# Patient Record
Sex: Male | Born: 1985 | Race: White | Hispanic: No | Marital: Married | Smoking: Never smoker
Health system: Southern US, Community
[De-identification: ages and names within clinical notes are randomized; demographics above are authoritative.]

---

## 2015-09-07 ENCOUNTER — Emergency Department (HOSPITAL_COMMUNITY): Payer: No Typology Code available for payment source

## 2015-09-07 ENCOUNTER — Encounter (HOSPITAL_COMMUNITY): Payer: Self-pay | Admitting: Nurse Practitioner

## 2015-09-07 ENCOUNTER — Emergency Department (HOSPITAL_COMMUNITY)
Admission: EM | Admit: 2015-09-07 | Discharge: 2015-09-08 | Disposition: A | Discharge status: Home / Self Care | Payer: No Typology Code available for payment source | Attending: Emergency Medicine | Admitting: Emergency Medicine

## 2015-09-07 DIAGNOSIS — S3991XA Unspecified injury of abdomen, initial encounter: Secondary | ICD-10-CM | POA: Insufficient documentation

## 2015-09-07 DIAGNOSIS — Y92410 Unspecified street and highway as the place of occurrence of the external cause: Secondary | ICD-10-CM | POA: Diagnosis not present

## 2015-09-07 DIAGNOSIS — Y9389 Activity, other specified: Secondary | ICD-10-CM | POA: Insufficient documentation

## 2015-09-07 DIAGNOSIS — S299XXA Unspecified injury of thorax, initial encounter: Secondary | ICD-10-CM | POA: Insufficient documentation

## 2015-09-07 DIAGNOSIS — S0083XA Contusion of other part of head, initial encounter: Secondary | ICD-10-CM | POA: Insufficient documentation

## 2015-09-07 DIAGNOSIS — S0990XA Unspecified injury of head, initial encounter: Secondary | ICD-10-CM | POA: Diagnosis present

## 2015-09-07 DIAGNOSIS — S199XXA Unspecified injury of neck, initial encounter: Secondary | ICD-10-CM | POA: Insufficient documentation

## 2015-09-07 DIAGNOSIS — S060X1A Concussion with loss of consciousness of 30 minutes or less, initial encounter: Secondary | ICD-10-CM | POA: Diagnosis not present

## 2015-09-07 DIAGNOSIS — Y998 Other external cause status: Secondary | ICD-10-CM | POA: Diagnosis not present

## 2015-09-07 DIAGNOSIS — M542 Cervicalgia: Secondary | ICD-10-CM

## 2015-09-07 LAB — I-STAT CHEM 8, ED
BUN: 7 mg/dL (ref 6–20)
CALCIUM ION: 1.15 mmol/L (ref 1.12–1.23)
CHLORIDE: 105 mmol/L (ref 101–111)
Creatinine, Ser: 1.1 mg/dL (ref 0.61–1.24)
GLUCOSE: 91 mg/dL (ref 65–99)
HCT: 47 % (ref 39.0–52.0)
Hemoglobin: 16 g/dL (ref 13.0–17.0)
Potassium: 3.9 mmol/L (ref 3.5–5.1)
SODIUM: 141 mmol/L (ref 135–145)
TCO2: 25 mmol/L (ref 0–100)

## 2015-09-07 LAB — SAMPLE TO BLOOD BANK

## 2015-09-07 LAB — CBC
HCT: 43.4 % (ref 39.0–52.0)
HEMOGLOBIN: 14.9 g/dL (ref 13.0–17.0)
MCH: 29.4 pg (ref 26.0–34.0)
MCHC: 34.3 g/dL (ref 30.0–36.0)
MCV: 85.8 fL (ref 78.0–100.0)
PLATELETS: 202 10*3/uL (ref 150–400)
RBC: 5.06 MIL/uL (ref 4.22–5.81)
RDW: 13.6 % (ref 11.5–15.5)
WBC: 7.9 10*3/uL (ref 4.0–10.5)

## 2015-09-07 LAB — PROTIME-INR
INR: 1.21 (ref 0.00–1.49)
PROTHROMBIN TIME: 15.5 s — AB (ref 11.6–15.2)

## 2015-09-07 LAB — CDS SEROLOGY

## 2015-09-07 LAB — I-STAT CREATININE, ED: CREATININE: 1.2 mg/dL (ref 0.61–1.24)

## 2015-09-07 MED ORDER — IOHEXOL 300 MG/ML  SOLN
100.0000 mL | Freq: Once | INTRAMUSCULAR | Status: AC | PRN
  Administered 2015-09-08: 100 mL via INTRAVENOUS

## 2015-09-07 MED ORDER — MORPHINE SULFATE (PF) 4 MG/ML IV SOLN
4.0000 mg | Freq: Once | INTRAVENOUS | Status: AC
  Administered 2015-09-07: 4 mg via INTRAVENOUS
  Filled 2015-09-07: qty 1

## 2015-09-07 NOTE — ED Notes (Signed)
Called family to update per patient request. [phone]
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
## 2015-09-08 LAB — COMPREHENSIVE METABOLIC PANEL
ALT: 42 U/L (ref 17–63)
AST: 69 U/L — ABNORMAL HIGH (ref 15–41)
Albumin: 4.4 g/dL (ref 3.5–5.0)
Alkaline Phosphatase: 101 U/L (ref 38–126)
Anion gap: 13 (ref 5–15)
BUN: 5 mg/dL — AB (ref 6–20)
CALCIUM: 9.6 mg/dL (ref 8.9–10.3)
CHLORIDE: 103 mmol/L (ref 101–111)
CO2: 24 mmol/L (ref 22–32)
CREATININE: 1.22 mg/dL (ref 0.61–1.24)
GFR calc non Af Amer: >60 mL/min (ref >60)
Glucose, Bld: 96 mg/dL (ref 65–99)
POTASSIUM: 3.8 mmol/L (ref 3.5–5.1)
SODIUM: 140 mmol/L (ref 135–145)
TOTAL PROTEIN: 7.6 g/dL (ref 6.5–8.1)
Total Bilirubin: 0.5 mg/dL (ref 0.3–1.2)

## 2015-09-08 LAB — ETHANOL

## 2015-09-08 MED ORDER — METHOCARBAMOL 500 MG PO TABS: 500.0000 mg | ORAL_TABLET | Freq: Every evening | ORAL | Status: AC | PRN

## 2015-09-08 MED ORDER — IBUPROFEN 600 MG PO TABS
600.0000 mg | ORAL_TABLET | Freq: Four times a day (QID) | ORAL | Status: AC | PRN
Start: 2015-09-08 — End: 2015-09-14

## 2015-09-08 MED ORDER — HYDROCODONE-ACETAMINOPHEN 5-325 MG PO TABS: 1.0000 | ORAL_TABLET | Freq: Three times a day (TID) | ORAL | Status: AC | PRN

## 2015-09-08 MED ORDER — MORPHINE SULFATE (PF) 4 MG/ML IV SOLN
6.0000 mg | Freq: Once | INTRAVENOUS | Status: AC
  Administered 2015-09-08: 6 mg via INTRAVENOUS
  Filled 2015-09-08: qty 2

## 2015-09-08 NOTE — ED Notes (Signed)
Pt stable, ambulatory, states understanding of discharge instructions 
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
## 2016-02-25 ENCOUNTER — Emergency Department (HOSPITAL_COMMUNITY): Payer: Self-pay

## 2016-02-25 ENCOUNTER — Emergency Department (HOSPITAL_COMMUNITY)
Admission: EM | Admit: 2016-02-25 | Discharge: 2016-02-25 | Disposition: A | Discharge status: Home / Self Care | Payer: Self-pay | Attending: Emergency Medicine | Admitting: Emergency Medicine

## 2016-02-25 ENCOUNTER — Encounter (HOSPITAL_COMMUNITY): Payer: Self-pay | Admitting: *Deleted

## 2016-02-25 DIAGNOSIS — W1789XA Other fall from one level to another, initial encounter: Secondary | ICD-10-CM | POA: Insufficient documentation

## 2016-02-25 DIAGNOSIS — M545 Low back pain, unspecified: Secondary | ICD-10-CM

## 2016-02-25 DIAGNOSIS — M25551 Pain in right hip: Secondary | ICD-10-CM | POA: Insufficient documentation

## 2016-02-25 DIAGNOSIS — R079 Chest pain, unspecified: Secondary | ICD-10-CM | POA: Insufficient documentation

## 2016-02-25 DIAGNOSIS — R52 Pain, unspecified: Secondary | ICD-10-CM

## 2016-02-25 DIAGNOSIS — Y999 Unspecified external cause status: Secondary | ICD-10-CM | POA: Insufficient documentation

## 2016-02-25 DIAGNOSIS — S63501A Unspecified sprain of right wrist, initial encounter: Secondary | ICD-10-CM | POA: Insufficient documentation

## 2016-02-25 DIAGNOSIS — M25552 Pain in left hip: Secondary | ICD-10-CM | POA: Insufficient documentation

## 2016-02-25 DIAGNOSIS — Y939 Activity, unspecified: Secondary | ICD-10-CM | POA: Insufficient documentation

## 2016-02-25 DIAGNOSIS — Y929 Unspecified place or not applicable: Secondary | ICD-10-CM | POA: Insufficient documentation

## 2016-02-25 DIAGNOSIS — M546 Pain in thoracic spine: Secondary | ICD-10-CM | POA: Insufficient documentation

## 2016-02-25 DIAGNOSIS — W19XXXA Unspecified fall, initial encounter: Secondary | ICD-10-CM

## 2016-02-25 MED ORDER — HYDROMORPHONE HCL 1 MG/ML IJ SOLN
1.0000 mg | Freq: Once | INTRAMUSCULAR | Status: AC
  Administered 2016-02-25: 1 mg via INTRAMUSCULAR
  Filled 2016-02-25: qty 1

## 2016-02-25 MED ORDER — OXYCODONE-ACETAMINOPHEN 5-325 MG PO TABS
1.0000 | ORAL_TABLET | Freq: Once | ORAL | Status: AC
  Administered 2016-02-25: 1 via ORAL
  Filled 2016-02-25: qty 1

## 2016-02-25 MED ORDER — OXYCODONE-ACETAMINOPHEN 5-325 MG PO TABS: 1.0000 | ORAL_TABLET | Freq: Four times a day (QID) | ORAL | 0 refills | Status: AC | PRN

## 2016-02-25 MED ORDER — NAPROXEN 500 MG PO TABS
500.0000 mg | ORAL_TABLET | Freq: Once | ORAL | Status: AC
  Administered 2016-02-25: 500 mg via ORAL
  Filled 2016-02-25: qty 1

## 2016-02-25 MED ORDER — NAPROXEN 500 MG PO TABS: 500.0000 mg | ORAL_TABLET | Freq: Two times a day (BID) | ORAL | 0 refills | Status: AC

## 2016-02-25 NOTE — ED Provider Notes (Signed)
[facility]-EMERGENCY DEPT Provider Note   CSN: [id number] Arrival date & time: 02/25/16  1454  First Provider Contact:  None    By signing my name below, I, [name], attest that this documentation has been prepared under the direction and in the presence of non-physician practitioner, Cheri Fowler, PA-C. Electronically Signed: [name], Scribe. 02/25/2016. 4:44 PM.  History   Chief Complaint Chief Complaint  Patient presents with  . Wrist Injury  . Hand Injury  . Hip Injury   The history is provided by the patient. No language interpreter was used.    HPI Comments: James Lucero is a 30 y.o. male who presents to the Emergency Department complaining of sudden onset, gradually worsening, "burning," right wrist and left hip pain s/p a fall that occurred earlier this morning. Pt reports he was standing on the bumper of his dad's lifted truck ~36 inches when he lost his balance and fell backwards; he notes he used his right wrist to catch himself and hyperextended it. He denies hitting his head or losing consciousness. He states the right side of his body is more painful compared to his left. He reports associated numbness of his right leg; he states he is experiencing shooting pain that radiates upwards through his lower back. He states he took tylenol with mild relief. Pt notes PSHx of bilateral hip fractures requiring ORIF.  History reviewed. No pertinent past medical history.  There are no active problems to display for this patient.  History reviewed. No pertinent surgical history.  Home Medications    Prior to Admission medications   Medication Sig Start Date End Date Taking? Authorizing Provider  naproxen (NAPROSYN) 500 MG tablet Take 1 tablet (500 mg total) by mouth 2 (two) times daily. 02/25/16   Cheri Fowler, PA-C  oxyCODONE-acetaminophen (PERCOCET/ROXICET) 5-325 MG tablet Take 1 tablet by mouth every 6 (six) hours as needed for severe pain. 02/25/16   Cheri Fowler, PA-C   Family  History No family history on file.  Social History Social History  Substance Use Topics  . Smoking status: Never Smoker  . Smokeless tobacco: Never Used  . Alcohol use No   Allergies   Review of patient's allergies indicates not on file.  Review of Systems Review of Systems  Musculoskeletal: Positive for arthralgias (right wrist and left hip), back pain and myalgias.  Neurological: Positive for numbness.  All other systems reviewed and are negative.  Physical Exam Updated Vital Signs BP 115/72 (BP Location: Left Arm)   Pulse 74   Temp 98.1 F (36.7 C) (Oral)   Resp 15   Ht  (1.753 m)   Wt 185 lb (83.9 kg)   SpO2 100%   BMI 27.32 kg/m   Physical Exam  Constitutional: He is oriented to person, place, and time. He appears well-developed and well-nourished.  HENT:  Head: Normocephalic and atraumatic.  Right Ear: External ear normal.  Left Ear: External ear normal.  Eyes: Conjunctivae are normal. No scleral icterus.  Neck: No tracheal deviation present.  No cervical midline tenderness.   Cardiovascular: Normal rate and regular rhythm.   Pulses:      Dorsalis pedis pulses are 2+ on the right side, and 2+ on the left side.  Pulmonary/Chest: Effort normal and breath sounds normal. No respiratory distress.  Abdominal: Soft. He exhibits no distension. There is no tenderness.  Musculoskeletal: He exhibits tenderness.       Right wrist: He exhibits decreased range of motion (due to pain), tenderness and  bony tenderness (mild anatomical snuffbox tenderness. ).       Right hip: He exhibits decreased range of motion and tenderness. He exhibits normal strength.       Left hip: He exhibits decreased range of motion and tenderness. He exhibits normal strength.       Thoracic back: He exhibits decreased range of motion (due to pain), tenderness, bony tenderness and pain.       Lumbar back: He exhibits decreased range of motion (due to pain), tenderness, bony tenderness and pain.        Right hand: He exhibits decreased range of motion (due to pain), tenderness and bony tenderness. He exhibits normal capillary refill. Normal sensation noted. Normal strength noted.  Thoracic and lumbar midline tenderness.   Neurological: He is alert and oriented to person, place, and time.  Strength and sensation intact throughout lower extremities.   Skin: Skin is warm and dry.  Psychiatric: He has a normal mood and affect. His behavior is normal.   ED Treatments / Results  Labs (all labs ordered are listed, but only abnormal results are displayed) Labs Reviewed - No data to display  EKG  EKG Interpretation None       Radiology Dg Thoracic Spine 2 View  Result Date: 02/25/2016 CLINICAL DATA:  Sudden onset of back pain after fall this morning, gradually worsening. EXAM: THORACIC SPINE 2 VIEWS COMPARISON:  CT chest dated 09/08/2015. FINDINGS: Mild dextroscoliosis of the thoracic spine is stable. There is also mild kyphosis. There is mild compression deformity of several vertebral bodies within the mid thoracic spine, of uncertain acuity, perhaps accentuated by the kyphosis and scoliosis. No corresponding finding seen on the earlier chest CT. Paravertebral soft tissues are unremarkable. IMPRESSION: Mild compression deformities of several mid thoracic vertebral bodies, of uncertain acuity, possibly accentuated by the mild dextroscoliosis and kyphosis of the thoracic spine. As there were no corresponding findings seen on earlier chest CT of 09/08/2015 and as patient has acute symptoms status post fall, would consider dedicated thoracic spine CT for more definitive characterization. Electronically Signed   By: Bary Richard M.D.   On: 02/25/2016 17:45   Dg Lumbar Spine Complete  Result Date: 02/25/2016 CLINICAL DATA:  Sudden onset of low back pain and left hip pain after fall this morning, gradual worsening. EXAM: LUMBAR SPINE - COMPLETE 4+ VIEW COMPARISON:  Plain film of the pelvis dated  09/07/2015 and CT abdomen dated 09/08/2015. FINDINGS: Four fixation screws traverse the bilateral sacroiliac joint spaces, intact and stable in alignment compared to the earlier plain film of the pelvis. Additional pubic ramus screw is partially imaged on today's exam. Alignment of the lumbar spine is stable, with minimal levoscoliosis. No acute or suspicious osseous finding. No fracture line or displaced fracture fragment seen. No pars interarticularis defects seen. Facet joints appear normally aligned throughout. Disc spaces appear well preserved throughout. IVC filter in place at the L3 vertebral body level. Visualized paravertebral soft tissues are otherwise unremarkable. IMPRESSION: 1. No acute findings. 2. Fixation hardware within the pelvis appears stable in position. Electronically Signed   By: Bary Richard M.D.   On: 02/25/2016 17:36   Dg Wrist Complete Right  Result Date: 02/25/2016 CLINICAL DATA:  Pain in both hips, right wrist, and hand. Swelling and bruising across the knuckles. Larey Seat backwards while working on a truck. EXAM: RIGHT WRIST - COMPLETE 3+ VIEW COMPARISON:  None. FINDINGS: There is no evidence of fracture or dislocation. There is no evidence of arthropathy or  other focal bone abnormality. Soft tissues are unremarkable. Deformity of the fifth metacarpal is consistent with prior injury. IMPRESSION: No evidence for acute  abnormality. Electronically Signed   By: Norva Pavlov M.D.   On: 02/25/2016 16:22   Ct Thoracic Spine Wo Contrast  Result Date: 02/25/2016 CLINICAL DATA:  Sudden-onset with gradual worsening burning left hip pain post fall this morning. Pain over right-sided body with right leg numbness. Pain radiates to lower back. EXAM: CT THORACIC AND LUMBAR SPINE WITHOUT CONTRAST TECHNIQUE: Multidetector CT imaging of the thoracic and lumbar spine was performed without contrast. Multiplanar CT image reconstructions were also generated. COMPARISON:  CT 09/08/2015. FINDINGS: CT  THORACIC SPINE FINDINGS Subtle biphasic curvature of the thoracolumbar spine unchanged. Vertebral body alignment and heights are within normal. There is minimal spondylosis of the thoracic spine with minimal disc space narrowing at multiple levels of the mid to lower thoracic spine. No acute compression fracture or subluxation. Two-3 mm stone over the upper pole right kidney. Remainder of the exam is within normal. CT LUMBAR SPINE FINDINGS Vertebral body alignment, heights and disc space heights are within normal. There is mild facet arthropathy over the lower lumbar spine. There is no disc herniation or canal stenosis. Minimal bilateral neural foraminal narrowing at the L5-S1 level. Subtle broad-based disc bulge at the L5-S1 level. No acute fracture or spondylolisthesis. There are 2 orthopedic screws bridging each sacroiliac joint intact and unchanged. Two tiny right renal stones are present.  IVC filter is present. IMPRESSION: CT THORACIC SPINE IMPRESSION No acute findings. Minimal spondylosis with mild multilevel degenerative disc disease. CT LUMBAR SPINE IMPRESSION No acute findings. Mild facet arthropathy with subtle disc disease and bilateral neural foraminal narrowing at the L5-S1 level. Hardware bridging the sacroiliac joints intact. Right-sided nephrolithiasis. Electronically Signed   By: Elberta Fortis M.D.   On: 02/25/2016 19:56   Ct Lumbar Spine Wo Contrast  Result Date: 02/25/2016 CLINICAL DATA:  Sudden-onset with gradual worsening burning left hip pain post fall this morning. Pain over right-sided body with right leg numbness. Pain radiates to lower back. EXAM: CT THORACIC AND LUMBAR SPINE WITHOUT CONTRAST TECHNIQUE: Multidetector CT imaging of the thoracic and lumbar spine was performed without contrast. Multiplanar CT image reconstructions were also generated. COMPARISON:  CT 09/08/2015. FINDINGS: CT THORACIC SPINE FINDINGS Subtle biphasic curvature of the thoracolumbar spine unchanged. Vertebral  body alignment and heights are within normal. There is minimal spondylosis of the thoracic spine with minimal disc space narrowing at multiple levels of the mid to lower thoracic spine. No acute compression fracture or subluxation. Two-3 mm stone over the upper pole right kidney. Remainder of the exam is within normal. CT LUMBAR SPINE FINDINGS Vertebral body alignment, heights and disc space heights are within normal. There is mild facet arthropathy over the lower lumbar spine. There is no disc herniation or canal stenosis. Minimal bilateral neural foraminal narrowing at the L5-S1 level. Subtle broad-based disc bulge at the L5-S1 level. No acute fracture or spondylolisthesis. There are 2 orthopedic screws bridging each sacroiliac joint intact and unchanged. Two tiny right renal stones are present.  IVC filter is present. IMPRESSION: CT THORACIC SPINE IMPRESSION No acute findings. Minimal spondylosis with mild multilevel degenerative disc disease. CT LUMBAR SPINE IMPRESSION No acute findings. Mild facet arthropathy with subtle disc disease and bilateral neural foraminal narrowing at the L5-S1 level. Hardware bridging the sacroiliac joints intact. Right-sided nephrolithiasis. Electronically Signed   By: Elberta Fortis M.D.   On: 02/25/2016 19:56   Dg  Hand Complete Right  Result Date: 02/25/2016 CLINICAL DATA:  Pt complains of pain in bilateral hips, right wrist and hand pain all over, swelling and bruising across knuckles. Pt states he fell back while working on a lifted truck today. Pt has hx of pelvic surgery in 2007. EXAM: RIGHT HAND - COMPLETE 3+ VIEW COMPARISON:  None. FINDINGS: There is deformity of the fifth metacarpal, consistent with old injury. No evidence for acute fracture or subluxation. Soft tissues are normal in appearance. No radiopaque foreign body. IMPRESSION: No evidence for acute  abnormality. Electronically Signed   By: Norva Pavlov M.D.   On: 02/25/2016 16:23   Dg Hips Bilat With Pelvis  3-4 Views  Result Date: 02/25/2016 CLINICAL DATA:  30 year old male who fell while working today. Bilateral hip pain. Personal history of pelvis surgery in 2007. Initial encounter. EXAM: DG HIP (WITH OR WITHOUT PELVIS) 3-4V BILAT COMPARISON:  CT Abdomen and Pelvis 09/08/2015. Pelvis radiograph 09/07/2015. FINDINGS: Sequelae of bilateral pelvis ORIF including cannulated screws traversing both SI joints and both superior pubic rami. Hardware appears stable and intact. Bony pelvis appears stable and intact. Partially visible abdominal IVC filter. Left hip joint space is preserved. Proximal left femur is intact. Right hip joint space is preserved. Proximal right femur is intact. No acute osseous abnormality identified. IMPRESSION: No acute osseous abnormality identified about the bilateral hips or pelvis. Stable bilateral pelvis ORIF hardware, and partially visible IVC filter. Electronically Signed   By: Odessa Fleming M.D.   On: 02/25/2016 16:21   Procedures Procedures  DIAGNOSTIC STUDIES:  Oxygen Saturation is 100% on RA, normal by my interpretation.    COORDINATION OF CARE:  4:42 PM Discussed treatment plan, which includes X-ray of lower spine with pt at bedside and pt agreed to plan.  Medications Ordered in ED Medications  naproxen (NAPROSYN) tablet 500 mg (500 mg Oral Given 02/25/16 1648)  HYDROmorphone (DILAUDID) injection 1 mg (1 mg Intramuscular Given 02/25/16 1758)  oxyCODONE-acetaminophen (PERCOCET/ROXICET) 5-325 MG per tablet 1 tablet (1 tablet Oral Given 02/25/16 2014)     Initial Impression / Assessment and Plan / ED Course  I have reviewed the triage vital signs and the nursing notes.  Pertinent labs & imaging results that were available during my care of the patient were reviewed by me and considered in my medical decision making (see chart for details).  Clinical Course   Patient presents with right hand/wrist pain, lower back pain, and bilateral hip pain s/p fall from ~36 inches earlier  this morning.  He is neurovascularly intact with decreased ROM secondary to pain.  No head injury or LOC.  No cervical midline tenderness.  Initial plain films obtained in triage of right hand/wrist/bilateral hips unremarkable, stable ORIF hardware.  Given thoracic and lumbar midline tenderness, plain films obtained as well and these Show no acute lumbar finding, however there are mild compression deformities of several mid thoracic vertebral bodies. These appear new from his chest CT in February of this year. Will obtain CT of thoracic and lumbar spine. CT thoracic and lumbar spine showed no acute findings. Patient given Naproxen and Dilaudid in ED.  home with short course of Percocet and naproxen. He has mild anatomical snuffbox tenderness, placed in thumb spica splint.  Possible hyperextension ligamentous injury, will give follow up with orthopedics. Return precautions discussed. Patient agrees and acknowledges the above plan for discharge.   Final Clinical Impressions(s) / ED Diagnoses   Final diagnoses:  Fall  Wrist sprain, right, initial encounter  Midline thoracic back pain  Midline low back pain without sciatica  Fall, initial encounter  Bilateral hip pain    New Prescriptions New Prescriptions   NAPROXEN (NAPROSYN) 500 MG TABLET    Take 1 tablet (500 mg total) by mouth 2 (two) times daily.   OXYCODONE-ACETAMINOPHEN (PERCOCET/ROXICET) 5-325 MG TABLET    Take 1 tablet by mouth every 6 (six) hours as needed for severe pain.   I personally performed the services described in this documentation, which was scribed in my presence. The recorded information has been reviewed and is accurate.     Cheri Fowler, PA-C 02/25/16 2029    Rolan Bucco, MD 02/25/16 [phone]

## 2016-02-25 NOTE — ED Triage Notes (Signed)
Pt complains of pain in his hips and right wrist and knuckles since falling today. Pt states he fell back while working on a lifted truck. Pt has hx of hip surgery.
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
## 2018-01-24 IMAGING — CT CT L SPINE W/O CM
4 of 6 series · 13 of 33 positions shown, 14 images · non-contrast
Comparison: CT 09/08/2015.

CLINICAL DATA: Sudden-onset with gradual worsening burning left hip
pain post fall this morning. Pain over right-sided body with right
leg numbness. Pain radiates to lower back.

EXAM:
CT THORACIC AND LUMBAR SPINE WITHOUT CONTRAST
TECHNIQUE: Multidetector CT imaging of the thoracic and lumbar spine was
performed without contrast. Multiplanar CT image reconstructions
were also generated.

[Series 4: l-spine · axial · 0.33mm/px · z∈[-164,-20]mm · 3 of 144 slices shown, 4 images]
[im 36/144  soft-tissue]
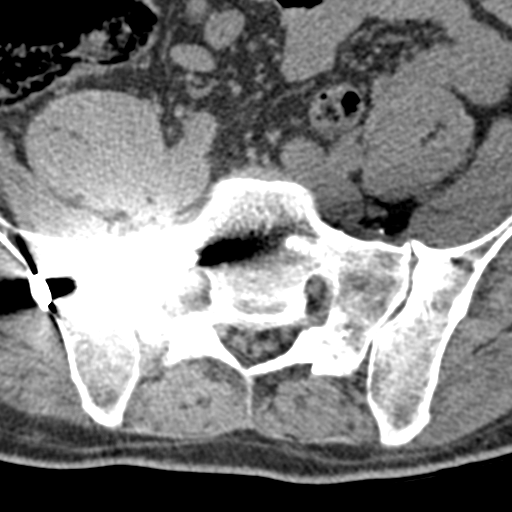
[im 36/144  bone]
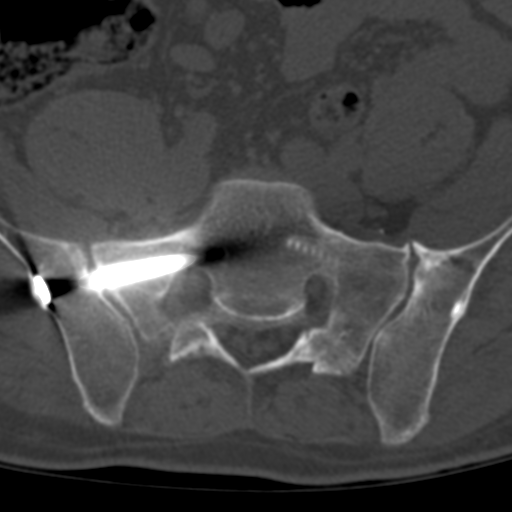
[im 72/144  bone]
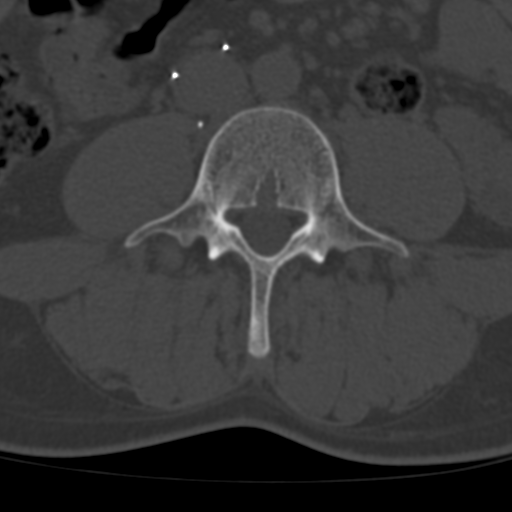
[im 108/144  bone]
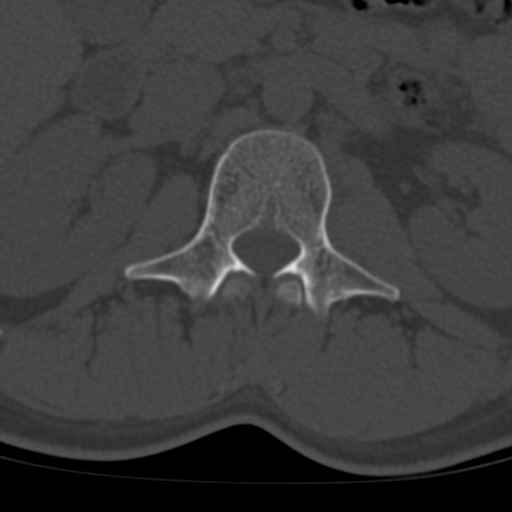

[Series 6: coronal · coronal · 0.23mm/px · 3 of 69 slices shown]
[im 14/69  bone]
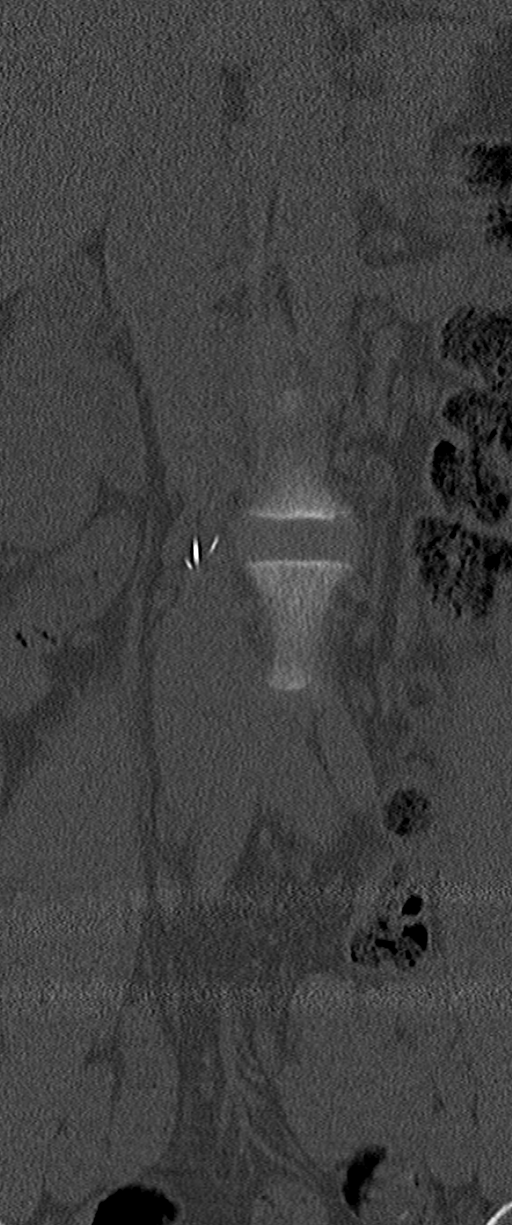
[im 28/69  bone]
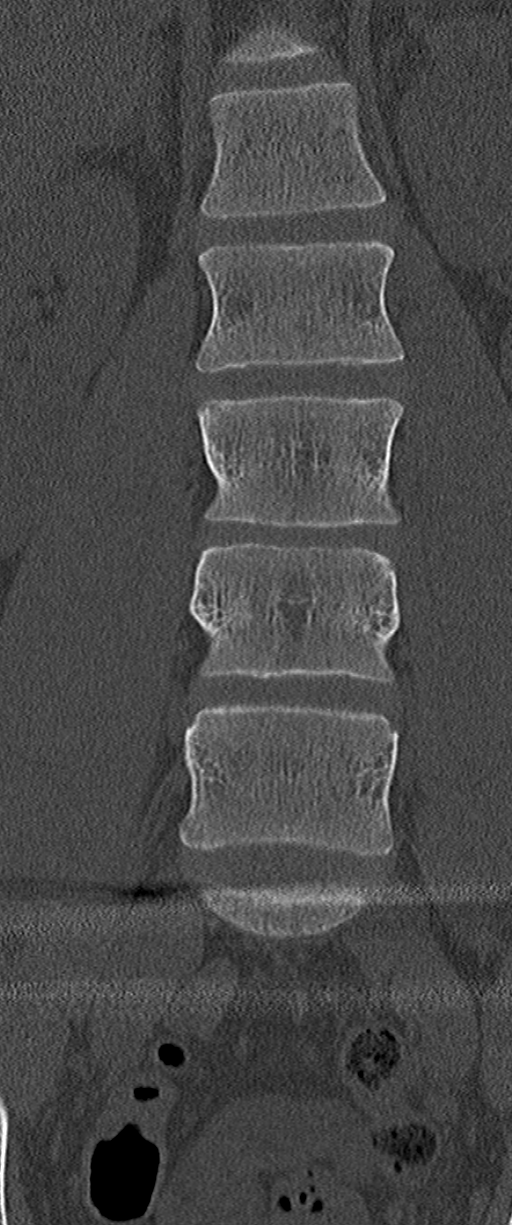
[im 41/69  bone]
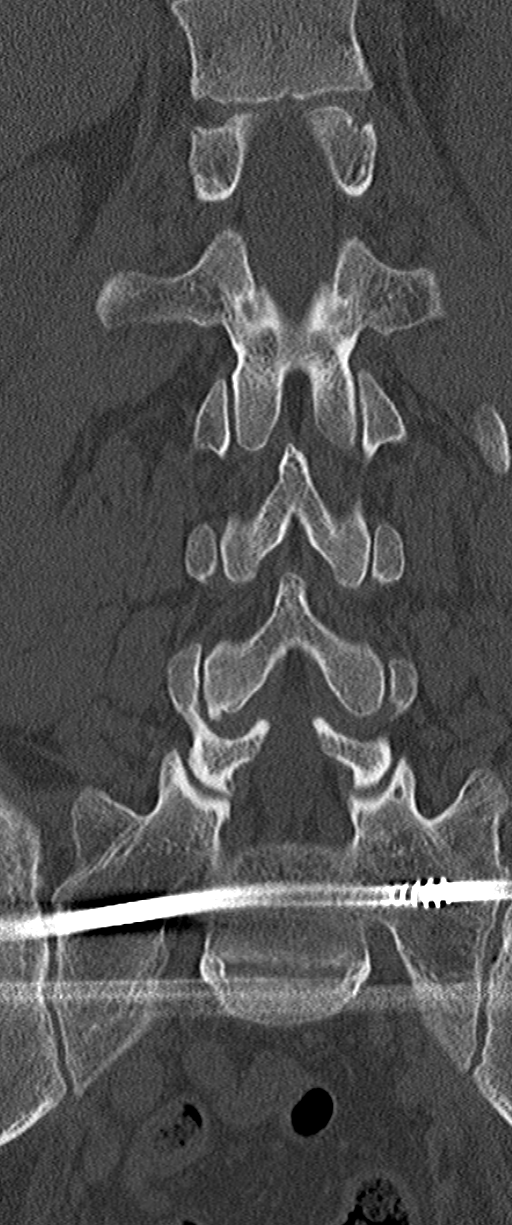

[Series 9: sagittal st · axial · 0.26mm/px · z∈[-165,-93]mm · 2 of 144 slices shown]
[im 36/144  bone]
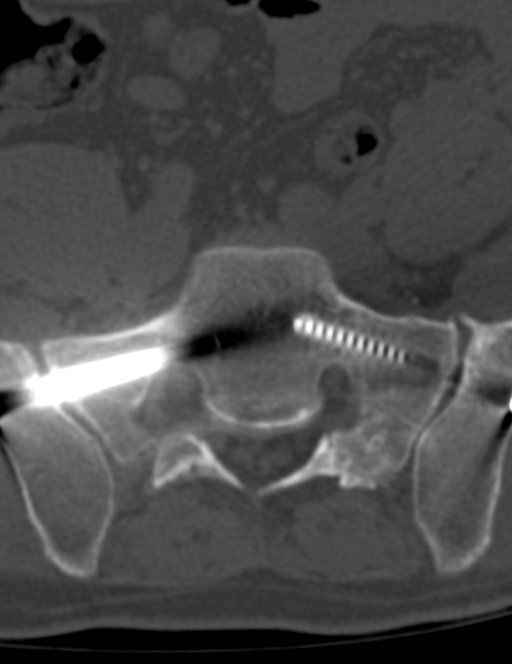
[im 72/144  bone]
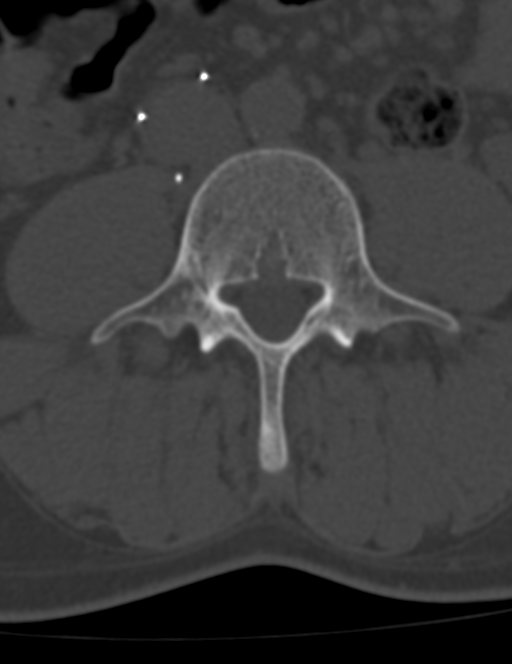

[Series 602: sag bone l-spine · sagittal · 0.56mm/px · 5 of 84 slices shown]
[im 14/84  bone]
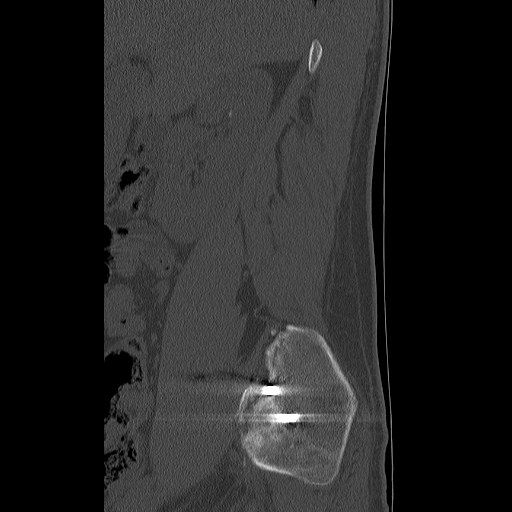
[im 28/84  bone]
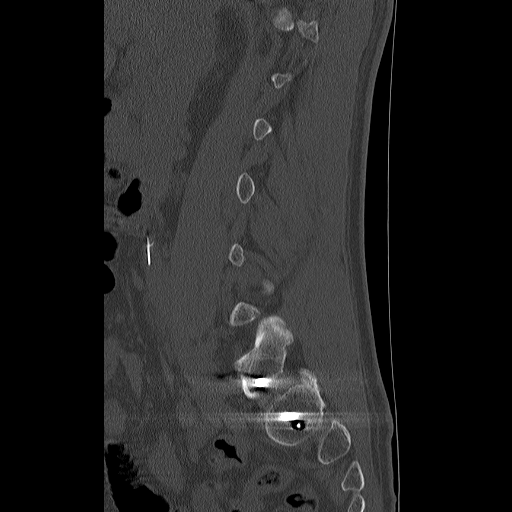
[im 42/84  bone]
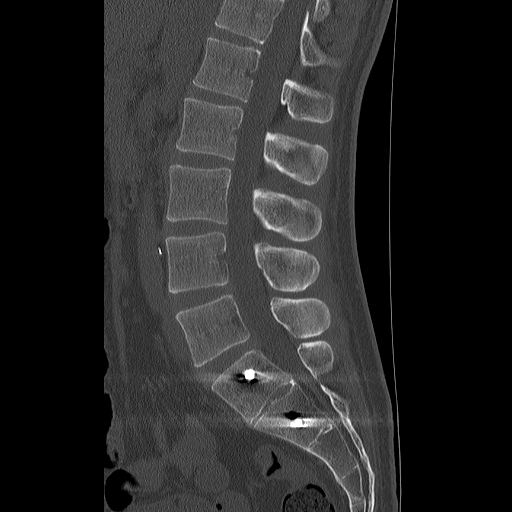
[im 56/84  bone]
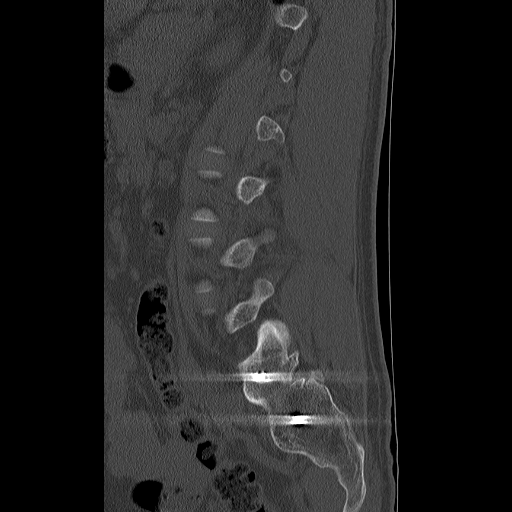
[im 70/84  bone]
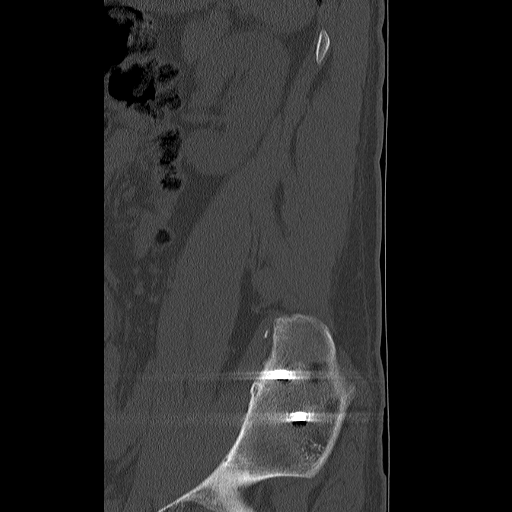

[13 of 33 positions shown; findings below may reference images not displayed]

FINDINGS: CT THORACIC SPINE FINDINGS

Subtle biphasic curvature of the thoracolumbar spine unchanged.
Vertebral body alignment and heights are within normal. There is
minimal spondylosis of the thoracic spine with minimal disc space
narrowing at multiple levels of the mid to lower thoracic spine. No
acute compression fracture or subluxation.

Nwo-D mm stone over the upper pole right kidney. Remainder of the
exam is within normal.

CT LUMBAR SPINE FINDINGS

Vertebral body alignment, heights and disc space heights are within
normal. There is mild facet arthropathy over the lower lumbar spine.
There is no disc herniation or canal stenosis. Minimal bilateral
neural foraminal narrowing at the L5-S1 level. Subtle broad-based
disc bulge at the L5-S1 level. No acute fracture or
spondylolisthesis.

There are 2 orthopedic screws bridging each sacroiliac joint intact
and unchanged.

Two tiny right renal stones are present.  IVC filter is present.
IMPRESSION: CT THORACIC SPINE IMPRESSION

No acute findings.

Minimal spondylosis with mild multilevel degenerative disc disease.

CT LUMBAR SPINE IMPRESSION

No acute findings.

Mild facet arthropathy with subtle disc disease and bilateral neural
foraminal narrowing at the L5-S1 level.

Hardware bridging the sacroiliac joints intact.

Right-sided nephrolithiasis.
